# Patient Record
Sex: Male | Born: 1995 | Race: Black or African American | Hispanic: No | Marital: Single | Smoking: Never smoker
Health system: Southern US, Community
[De-identification: ages and names within clinical notes are randomized; demographics above are authoritative.]

## PROBLEM LIST (undated history)

## (undated) DIAGNOSIS — Z91010 Allergy to peanuts: Secondary | ICD-10-CM

---
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
## 2017-09-23 ENCOUNTER — Encounter: Payer: Self-pay | Admitting: Sports Medicine

## 2017-09-27 ENCOUNTER — Telehealth: Payer: Self-pay

## 2017-09-27 NOTE — Telephone Encounter (Signed)
Spoke to Tom Soto, head ATC.  Discussed PRP, [facility], or here.  Considering its free at his school I have recommended that he do it there.

## 2017-09-27 NOTE — Telephone Encounter (Signed)
I will but its all moot without touching the patient's knee.  Also they have to drop off the MRI.

## 2017-09-27 NOTE — Telephone Encounter (Signed)
Mother of pt left VM asking for you to call Pt coach Lynnae JanuaryBryan Lund at [phone] to answer some questions regarding possible PRP. Please assist.

## 2017-09-27 NOTE — Telephone Encounter (Signed)
Mother of pt notified. Would still like for you to look at MRI for a second opinion.

## 2017-09-29 NOTE — Telephone Encounter (Signed)
I received and personally reviewed medicines MRI, he does have increased T2 signal at the deep insertion of the patellar tendon on the patella.  This is consistent with mild patellar tendinitis.  He also has some increased T2 signal at the distal insertion of the quadriceps tendon on the superior pole of the patella consistent with quadriceps tendinitis.  There is also a mildly swollen deep infrapatellar bursa just deep to the tibial insertion of the patellar tendon.  There is a mild joint effusion.  Menisci look okay, patellofemoral cartilage as well as tibiofemoral cartilage look okay.  No visible intra-articular loose bodies, MCL, LCL, PCL, and ACL are all intact.  There is a small cyst just posterior to the tibial origin of the PCL.

## 2017-09-30 NOTE — Telephone Encounter (Signed)
Discussed with pt mother.

## 2017-10-24 ENCOUNTER — Ambulatory Visit (INDEPENDENT_AMBULATORY_CARE_PROVIDER_SITE_OTHER): Payer: BC Managed Care – PPO | Admitting: Sports Medicine

## 2017-10-24 ENCOUNTER — Encounter: Payer: Self-pay | Admitting: Sports Medicine

## 2017-10-24 DIAGNOSIS — G8929 Other chronic pain: Secondary | ICD-10-CM

## 2017-10-24 DIAGNOSIS — M25562 Pain in left knee: Secondary | ICD-10-CM | POA: Insufficient documentation

## 2017-10-24 MED ORDER — HYDROCODONE-ACETAMINOPHEN 5-325 MG PO TABS: 1.0000 | ORAL_TABLET | Freq: Three times a day (TID) | ORAL | 0 refills | Status: DC | PRN

## 2017-10-24 NOTE — Assessment & Plan Note (Signed)
The MRI did show multiple pathologic changes including a deep infrapatellar bursitis, there was some increased T2 signal at the patellar insertion of the patellar tendon. Clinically his symptoms are further down likely representing a deep infrapatellar bursitis/distal insertional patellar tendinitis. He has failed months of rehabilitation with his athletic trainer in college. PRP percutaneous tenotomy today. Knee immobilizer for a week, he can use Tylenol and narcotics for pain but should avoid NSAIDs. After a week I think it is okay for him to get into a regular knee brace and start gentle range of motion exercises for the next 2-3 weeks, followed by active range of motion exercises for 2-3 weeks after that and then followed by strengthening and eccentric rehab afterwards.

## 2017-10-24 NOTE — Progress Notes (Signed)
   Procedure: Real-time Ultrasound Guided Platelet Rich Plasma (PRP) Injection of  Device: GE Logiq E  Verbal informed consent obtained.  Time-out conducted.  Noted no overlying erythema, induration, or other signs of local infection.  Obtained 30 cc of blood from peripheral vein, using the "PEAK" centrifuge, red blood cells were separated from the plasma. Subsequently red blood cells were drained leaving only plasma with the buffy coat layer between the desired lines. Platelet poor plasma was then centrifuged out, and remaining platelet rich plasma aspirated into a 5 cc syringe.  Skin prepped in a sterile fashion.  Local anesthesia: Topical Ethyl chloride.  With sterile technique and under real time ultrasound guidance the platelet rich plasma (PRP) obtained above: Using a 22-gauge needle I injected 3 cc of lidocaine and 3 cc of bupivacaine both superficial to and deep to the distal tibial insertion of the patellar tendon, I then switched to the syringe and injected the full 3 mL of platelet rich plasma into and just deep to the distal tibial insertion of the patellar tendon performing a percutaneous fenestration as well as injection to the deep infrapatellar bursa. Completed without difficulty  Advised to call if fevers/chills, erythema, induration, drainage, or persistent bleeding.  Images permanently stored and available for review in the ultrasound unit.  Impression: Technically successful ultrasound guided Platelet Rich Plasma (PRP) injection.

## 2017-10-31 ENCOUNTER — Encounter: Payer: Self-pay | Admitting: Sports Medicine

## 2017-10-31 ENCOUNTER — Ambulatory Visit (INDEPENDENT_AMBULATORY_CARE_PROVIDER_SITE_OTHER): Payer: BC Managed Care – PPO | Admitting: Sports Medicine

## 2017-10-31 DIAGNOSIS — M25562 Pain in left knee: Secondary | ICD-10-CM

## 2017-10-31 DIAGNOSIS — G8929 Other chronic pain: Secondary | ICD-10-CM

## 2017-10-31 NOTE — Assessment & Plan Note (Signed)
1 week post distal patellar tendon insertion percutaneous tenotomy with PRP injection. We also placed some medication in the deep infrapatellar bursa. He returns today completely pain-free. [facility] and his trainer will take over eccentric rehabilitation. They can call me with any questions.

## 2017-10-31 NOTE — Progress Notes (Signed)
  Subjective: Tom Soto returns, he is 1 week post PRP percutaneous tenotomy at the distal left patellar tendon insertion as well as deep infrapatellar bursa, he is completely pain-free.  Objective: General: Well-developed, well-nourished, and in no acute distress. Left knee: Normal to inspection with no erythema or effusion or obvious bony abnormalities. Palpation normal with no warmth or joint line tenderness or patellar tenderness or condyle tenderness. ROM normal in flexion and extension and lower leg rotation. Ligaments with solid consistent endpoints including ACL, PCL, LCL, MCL. Negative Mcmurray's and provocative meniscal tests. Non painful patellar compression. Patellar and quadriceps tendons unremarkable. Hamstring and quadriceps strength is normal.  Assessment/plan:   Left knee pain 1 week post distal patellar tendon insertion percutaneous tenotomy with PRP injection. We also placed some medication in the deep infrapatellar bursa. He returns today completely pain-free. [facility] [place] and his trainer will take over eccentric rehabilitation. They can call me with any questions. ___________________________________________ Ihor Austinhomas J. Benjamin Stainhekkekandam, M.D., ABFM., CAQSM. Primary Care and Sports Medicine [facility]  Adjunct Instructor of Family Medicine  [employer]

## 2017-11-01 ENCOUNTER — Ambulatory Visit: Payer: BC Managed Care – PPO | Admitting: Sports Medicine

## 2019-01-25 ENCOUNTER — Ambulatory Visit (INDEPENDENT_AMBULATORY_CARE_PROVIDER_SITE_OTHER): Payer: BC Managed Care – PPO

## 2019-01-25 ENCOUNTER — Encounter: Payer: Self-pay | Admitting: Sports Medicine

## 2019-01-25 ENCOUNTER — Other Ambulatory Visit: Payer: Self-pay

## 2019-01-25 ENCOUNTER — Ambulatory Visit (INDEPENDENT_AMBULATORY_CARE_PROVIDER_SITE_OTHER): Payer: BC Managed Care – PPO | Admitting: Sports Medicine

## 2019-01-25 DIAGNOSIS — M7651 Patellar tendinitis, right knee: Secondary | ICD-10-CM

## 2019-01-25 DIAGNOSIS — M25561 Pain in right knee: Secondary | ICD-10-CM | POA: Diagnosis not present

## 2019-01-25 DIAGNOSIS — M7652 Patellar tendinitis, left knee: Secondary | ICD-10-CM | POA: Diagnosis not present

## 2019-01-25 NOTE — Progress Notes (Signed)
Subjective:    CC: Right knee pain  HPI: This is a pleasant 23 year old male, softball player at the [employer] last year we did a PRP percutaneous tenotomy of the distal patellar tendon insertion on the left side.  He did extremely well, he is now having similar symptoms on the right, present for 2 weeks now, has been doing some home rehabilitation exercises without any improvement.  He and his trainers are interested in pursuing PRP percutaneous tenotomy on the right side.  Symptoms are moderate, persistent, localized without radiation.  I reviewed the past medical history, family history, social history, surgical history, and allergies today and no changes were needed.  Please see the problem list section below in epic for further details.  Past Medical History: Past Medical History:  Diagnosis Date  . History of peanut allergy    rash only; does not need epi-pen   Past Surgical History: No past surgical history on file. Social History: Social History   Socioeconomic History  . Marital status: Single    Spouse name: Not on file  . Number of children: Not on file  . Years of education: Not on file  . Highest education level: Not on file  Occupational History  . Not on file  Social Needs  . Financial resource strain: Not on file  . Food insecurity:    Worry: Not on file    Inability: Not on file  . Transportation needs:    Medical: Not on file    Non-medical: Not on file  Tobacco Use  . Smoking status: Never Smoker  . Smokeless tobacco: Never Used  Substance and Sexual Activity  . Alcohol use: No  . Drug use: No  . Sexual activity: Not on file  Lifestyle  . Physical activity:    Days per week: Not on file    Minutes per session: Not on file  . Stress: Not on file  Relationships  . Social connections:    Talks on phone: Not on file    Gets together: Not on file    Attends religious service: Not on file    Active member of club or organization: Not on  file    Attends meetings of clubs or organizations: Not on file    Relationship status: Not on file  Other Topics Concern  . Not on file  Social History Narrative  . Not on file   Family History: Family History  Problem Relation Age of Onset  . Diabetes Paternal Aunt   . Heart failure Maternal Uncle   . Hypertension Maternal Uncle    Allergies: Allergies  Allergen Reactions  . Peanut Oil Itching    Itching in throat, no swelling   . Other    Medications: See med rec.  Review of Systems: No fevers, chills, night sweats, weight loss, chest pain, or shortness of breath.   Objective:    General: Well Developed, well nourished, and in no acute distress.  Neuro: Alert and oriented x3, extra-ocular muscles intact, sensation grossly intact.  HEENT: Normocephalic, atraumatic, pupils equal round reactive to light, neck supple, no masses, no lymphadenopathy, thyroid nonpalpable.  Skin: Warm and dry, no rashes. Cardiac: Regular rate and rhythm, no murmurs rubs or gallops, no lower extremity edema.  Respiratory: Clear to auscultation bilaterally. Not using accessory muscles, speaking in full sentences. Right knee: Normal to inspection with no erythema or effusion or obvious bony abnormalities. Palpation normal with no warmth or joint line tenderness or patellar tenderness or  condyle tenderness. ROM normal in flexion and extension and lower leg rotation. Ligaments with solid consistent endpoints including ACL, PCL, LCL, MCL. Negative Mcmurray's and provocative meniscal tests. Non painful patellar compression. Tender to palpation at the distal insertion of the patellar tendon. Hamstring and quadriceps strength is normal.  Impression and Recommendations:    Patellar tendinitis of right knee Distal patellar tendinitis, he did extremely well with a PRP percutaneous tenotomy on the left side 1 year ago. He has done a couple of weeks of aggressive physical therapy with his trainers  without much improvement. X-rays today. Avoid NSAIDs, hydrate aggressively and he can come back next week for the PRP procedure.   ___________________________________________ Ihor Austinhomas J. Benjamin Stainhekkekandam, M.D., ABFM., CAQSM. Primary Care and Sports Medicine [facility]  Adjunct Professor of Family Medicine  [employer]

## 2019-01-25 NOTE — Assessment & Plan Note (Signed)
Distal patellar tendinitis, he did extremely well with a PRP percutaneous tenotomy on the left side 1 year ago. He has done a couple of weeks of aggressive physical therapy with his trainers without much improvement. X-rays today. Avoid NSAIDs, hydrate aggressively and he can come back next week for the PRP procedure.

## 2019-01-31 ENCOUNTER — Ambulatory Visit (INDEPENDENT_AMBULATORY_CARE_PROVIDER_SITE_OTHER): Payer: BC Managed Care – PPO | Admitting: Sports Medicine

## 2019-01-31 ENCOUNTER — Encounter: Payer: Self-pay | Admitting: Sports Medicine

## 2019-01-31 DIAGNOSIS — M7651 Patellar tendinitis, right knee: Secondary | ICD-10-CM

## 2019-01-31 MED ORDER — HYDROCODONE-ACETAMINOPHEN 5-325 MG PO TABS: 1.0000 | ORAL_TABLET | Freq: Three times a day (TID) | ORAL | 0 refills | Status: DC | PRN

## 2019-01-31 NOTE — Progress Notes (Addendum)
   Procedure: Real-time Ultrasound Guided Platelet Rich Plasma (PRP) percutaneous tenotomy of right distal patellar tendon insertion Device: GE Logiq E  Verbal informed consent obtained.  Time-out conducted.  Noted no overlying erythema, induration, or other signs of local infection.  Obtained 30 cc of blood from peripheral vein, using the "PEAK" centrifuge, red blood cells were separated from the plasma. Subsequently red blood cells were drained leaving only plasma with the buffy coat layer between the desired lines. Platelet poor plasma was then centrifuged out, and remaining platelet rich plasma aspirated into a 5 cc syringe.  Skin prepped in a sterile fashion.  Local anesthesia: Topical Ethyl chloride.  With sterile technique and under real time ultrasound guidance the platelet rich plasma (PRP) obtained above: I injected 3 cc lidocaine, 3 cc bupivacaine into and around the distal insertion of the patellar tendon using ultrasound guidance, the syringe was switched and I injected the entirety of platelet rich plasma into the distal, deep insertion of the patellar tendon at the tibial tubercle. Completed without difficulty  Advised to call if fevers/chills, erythema, induration, drainage, or persistent bleeding.  Images permanently stored and available for review in the ultrasound unit.  Impression: Technically successful ultrasound guided Platelet Rich Plasma (PRP) injection.  ______________________________________________________________________________________  Patellar tendinitis of right knee Distal patellar tendinitis/deep infrapatellar bursitis, did extremely well with PRP percutaneous tenotomy on the left side 1 year ago. He has similar pain on the right, and has done several weeks of aggressive formal physical therapy with his trainers without much improvement. He has avoided NSAIDs and aggressively hydrated, and we performed a PRP percutaneous tenotomy of the deep infrapatellar bursa  and distal patellar tendon insertion today. Hydrocodone for postprocedural pain. Knee immobilizer for a 2 or 3 days to start. I have discussed my postoperative protocol with his athletic trainers, return to see me in 2 weeks.  The procedure was videotaped for educational purposes, verbal consent was obtained from the patient.   ___________________________________________ Ihor Austin. Benjamin Stain, M.D., ABFM., CAQSM. Primary Care and Sports Medicine [facility]  Adjunct Professor of Family Medicine  [employer]

## 2019-01-31 NOTE — Patient Instructions (Signed)
Knee immobilizer for 3 days. May ice the knee for 20 minutes 3-4 times a day, avoid anti-inflammatories such as aspirin, ibuprofen, naproxen. Use hydrocodone for pain. Gentle protected range of motion for the next 3 to 7 days. Then follow rehabilitation protocol from your athletic trainers.

## 2019-01-31 NOTE — Assessment & Plan Note (Addendum)
Distal patellar tendinitis/deep infrapatellar bursitis, did extremely well with PRP percutaneous tenotomy on the left side 1 year ago. He has similar pain on the right, and has done several weeks of aggressive formal physical therapy with his trainers without much improvement. He has avoided NSAIDs and aggressively hydrated, and we performed a PRP percutaneous tenotomy of the deep infrapatellar bursa and distal patellar tendon insertion today. Hydrocodone for postprocedural pain. Knee immobilizer for a 2 or 3 days to start. I have discussed my postoperative protocol with his athletic trainers, return to see me in 2 weeks.  The procedure was videotaped for educational purposes, verbal consent was obtained from the patient.

## 2019-02-01 ENCOUNTER — Telehealth: Payer: Self-pay | Admitting: *Deleted

## 2019-02-01 NOTE — Telephone Encounter (Signed)
Pt left vm this morning wanting to know if you'd write him a note excusing him from his online classes today.  He stated that he's a little sore and just wanted to take it easy today.

## 2019-02-01 NOTE — Telephone Encounter (Signed)
Of course.  I'll be in the printer in the am.  If not, please print it out and he can pick it up.

## 2019-02-02 ENCOUNTER — Telehealth: Payer: Self-pay | Admitting: *Deleted

## 2019-02-02 NOTE — Telephone Encounter (Signed)
That likely has to do with the trauma we created in the knee as well as wearing the knee immobilizer. Since we did not make an incision there is no limitation on getting the procedural site wet.  Elevate the leg when resting above the heart.  I think he can probably discontinue the knee immobilizer if he feels okay.

## 2019-02-02 NOTE — Telephone Encounter (Signed)
Letter printed and given to Tom Soto to email to pt's mom.

## 2019-02-02 NOTE — Telephone Encounter (Signed)
Pt's mom left vm stating that pt's foot is swelling and wanted to know if that's normal.  Also, she wanted to know when he'd be abloe to take a shower instead of sponging off.  Please advise.

## 2019-02-02 NOTE — Telephone Encounter (Signed)
Pt and pt's mom notified of instructions.

## 2019-02-09 ENCOUNTER — Encounter: Payer: Self-pay | Admitting: Sports Medicine

## 2019-02-09 ENCOUNTER — Ambulatory Visit (INDEPENDENT_AMBULATORY_CARE_PROVIDER_SITE_OTHER): Payer: BC Managed Care – PPO | Admitting: Sports Medicine

## 2019-02-09 DIAGNOSIS — M7651 Patellar tendinitis, right knee: Secondary | ICD-10-CM | POA: Diagnosis not present

## 2019-02-09 NOTE — Assessment & Plan Note (Signed)
Now approximately 9 days post PRP percutaneous tenotomy of the distal patellar tendon. For the most part pain-free, knee is a bit stiff. Transition into a reaction knee brace. He will work with his trainers on gentle passive range of motion for the next week and then active range of motion for another couple of weeks, and then strengthening. No more pain medication needed. I'd like to see him back in a month.

## 2019-02-09 NOTE — Progress Notes (Signed)
Subjective:    CC: Right knee  HPI: Tom Soto returns, he is approximately 9 days post platelet rich plasma percutaneous tenotomy of the right distal patellar tendon, near the tibial insertion.  He is doing very well, he has no pain.  I reviewed the past medical history, family history, social history, surgical history, and allergies today and no changes were needed.  Please see the problem list section below in epic for further details.  Past Medical History: Past Medical History:  Diagnosis Date  . History of peanut allergy    rash only; does not need epi-pen   Past Surgical History: No past surgical history on file. Social History: Social History   Socioeconomic History  . Marital status: Single    Spouse name: Not on file  . Number of children: Not on file  . Years of education: Not on file  . Highest education level: Not on file  Occupational History  . Not on file  Social Needs  . Financial resource strain: Not on file  . Food insecurity:    Worry: Not on file    Inability: Not on file  . Transportation needs:    Medical: Not on file    Non-medical: Not on file  Tobacco Use  . Smoking status: Never Smoker  . Smokeless tobacco: Never Used  Substance and Sexual Activity  . Alcohol use: No  . Drug use: No  . Sexual activity: Not on file  Lifestyle  . Physical activity:    Days per week: Not on file    Minutes per session: Not on file  . Stress: Not on file  Relationships  . Social connections:    Talks on phone: Not on file    Gets together: Not on file    Attends religious service: Not on file    Active member of club or organization: Not on file    Attends meetings of clubs or organizations: Not on file    Relationship status: Not on file  Other Topics Concern  . Not on file  Social History Narrative  . Not on file   Family History: Family History  Problem Relation Age of Onset  . Diabetes Paternal Aunt   . Heart failure Maternal Uncle   .  Hypertension Maternal Uncle    Allergies: Allergies  Allergen Reactions  . Peanut Oil Itching    Itching in throat, no swelling   . Other    Medications: See med rec.  Review of Systems: No fevers, chills, night sweats, weight loss, chest pain, or shortness of breath.   Objective:    General: Well Developed, well nourished, and in no acute distress.  Neuro: Alert and oriented x3, extra-ocular muscles intact, sensation grossly intact.  HEENT: Normocephalic, atraumatic, pupils equal round reactive to light, neck supple, no masses, no lymphadenopathy, thyroid nonpalpable.  Skin: Warm and dry, no rashes. Cardiac: Regular rate and rhythm, no murmurs rubs or gallops, no lower extremity edema.  Respiratory: Clear to auscultation bilaterally. Not using accessory muscles, speaking in full sentences. Right knee: Normal to inspection with no erythema or effusion or obvious bony abnormalities. Palpation normal with no warmth or joint line tenderness or patellar tenderness or condyle tenderness. Minimal fullness in the distal patellar tendon without discrete tenderness to palpation, he does have some apprehension to full flexion but is able to achieve it without much discomfort. Ligaments with solid consistent endpoints including ACL, PCL, LCL, MCL. Negative Mcmurray's and provocative meniscal tests. Non painful patellar compression.  Patellar and quadriceps tendons unremarkable. Hamstring and quadriceps strength is normal.  Impression and Recommendations:    Patellar tendinitis of right knee Now approximately 9 days post PRP percutaneous tenotomy of the distal patellar tendon. For the most part pain-free, knee is a bit stiff. Transition into a reaction knee brace. He will work with his trainers on gentle passive range of motion for the next week and then active range of motion for another couple of weeks, and then strengthening. No more pain medication needed. I'd like to see him back in a  month.   ___________________________________________ Tom Soto, M.D., ABFM., CAQSM. Primary Care and Sports Medicine [facility]  Adjunct Professor of Family Medicine  [employer]

## 2019-02-12 ENCOUNTER — Ambulatory Visit: Payer: BC Managed Care – PPO | Admitting: Sports Medicine

## 2019-03-09 ENCOUNTER — Ambulatory Visit: Payer: BC Managed Care – PPO | Admitting: Sports Medicine

## 2019-10-30 ENCOUNTER — Ambulatory Visit: Payer: BC Managed Care – PPO | Attending: Internal Medicine

## 2019-10-31 ENCOUNTER — Ambulatory Visit: Payer: BC Managed Care – PPO | Attending: Internal Medicine

## 2019-10-31 DIAGNOSIS — Z20822 Contact with and (suspected) exposure to covid-19: Secondary | ICD-10-CM

## 2019-11-01 LAB — NOVEL CORONAVIRUS, NAA: SARS-CoV-2, NAA: DETECTED — AB

## 2019-11-05 ENCOUNTER — Other Ambulatory Visit: Payer: BC Managed Care – PPO

## 2020-07-14 IMAGING — DX LEFT KNEE - 1-2 VIEW
4 series · 4 of 4 positions shown · non-contrast
Comparison: None.

CLINICAL DATA: Right knee pain for the past 2 weeks. Pain is
primarily below the patella. History tenotomy of the distal
left-sided patellar tendon last year, now with similar symptoms
within the right lower extremity.

EXAM:
LEFT KNEE - 1-2 VIEW

[tunnel]
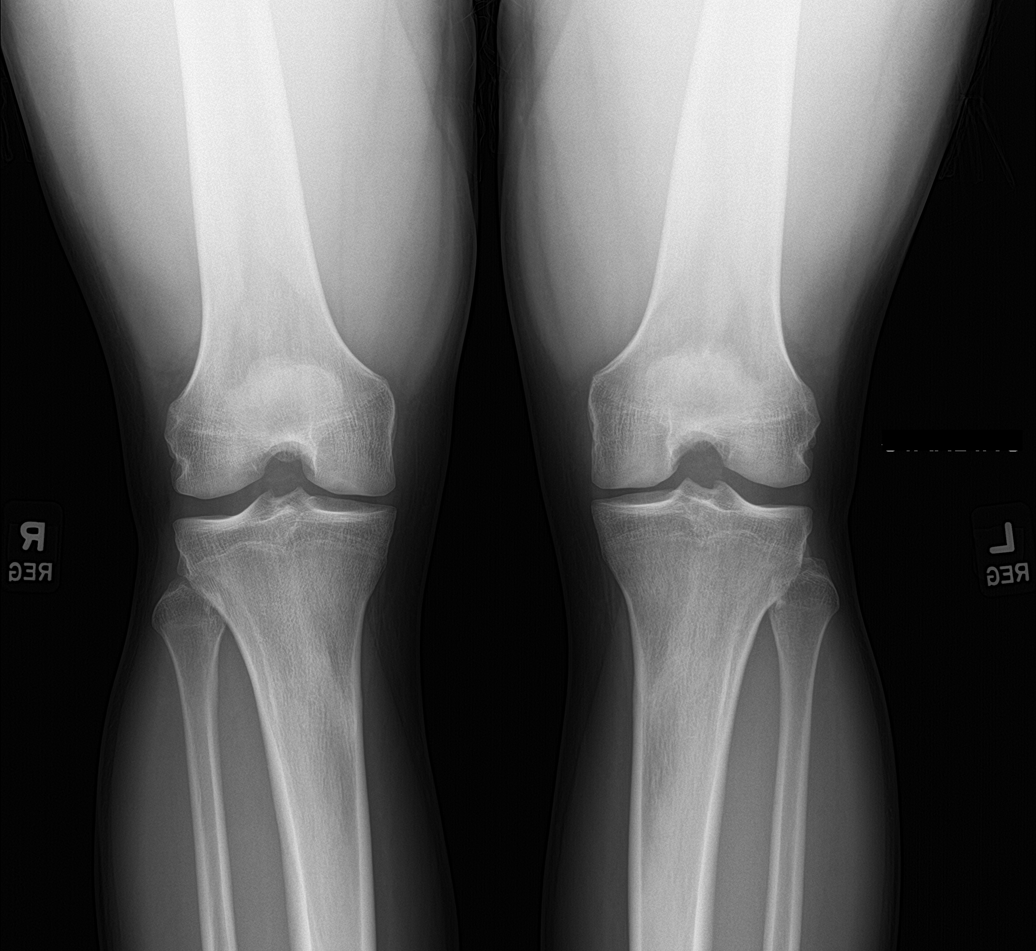

[knee lat]
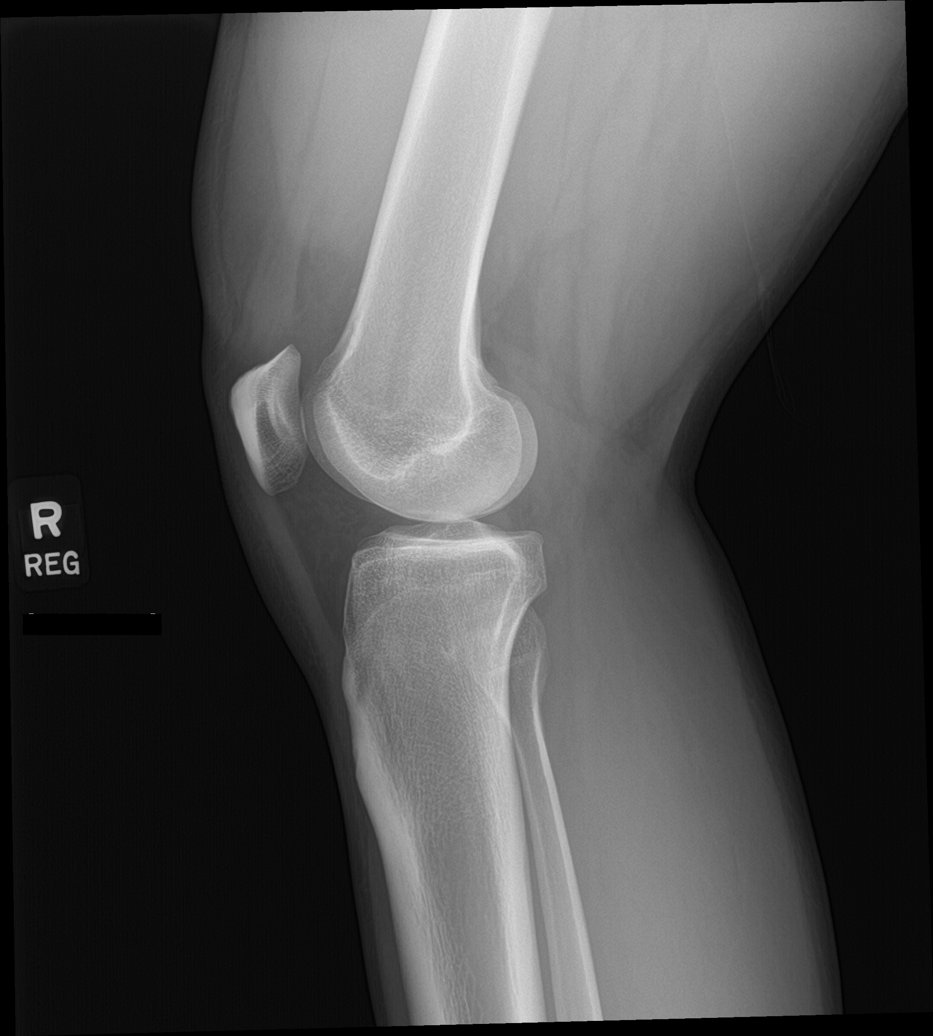

[knee sunrise]
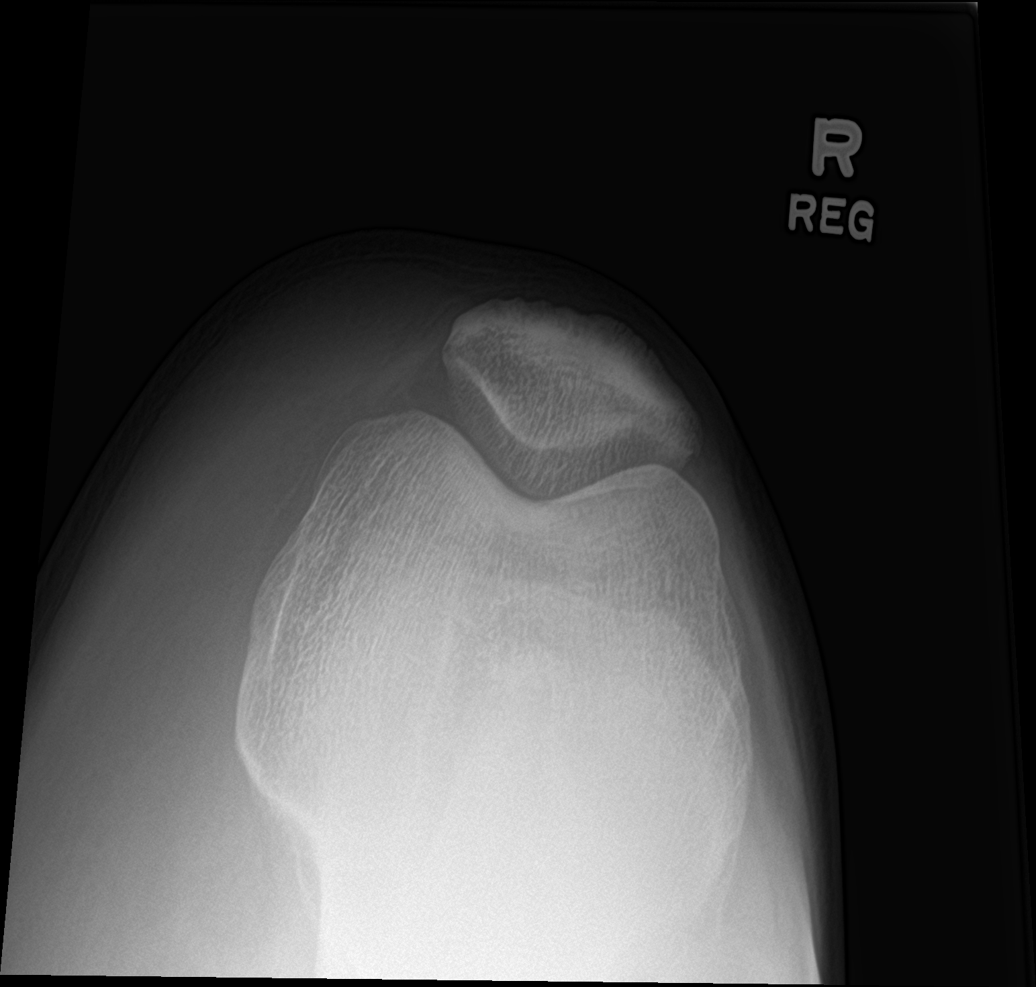

[knee ap bilat standing]
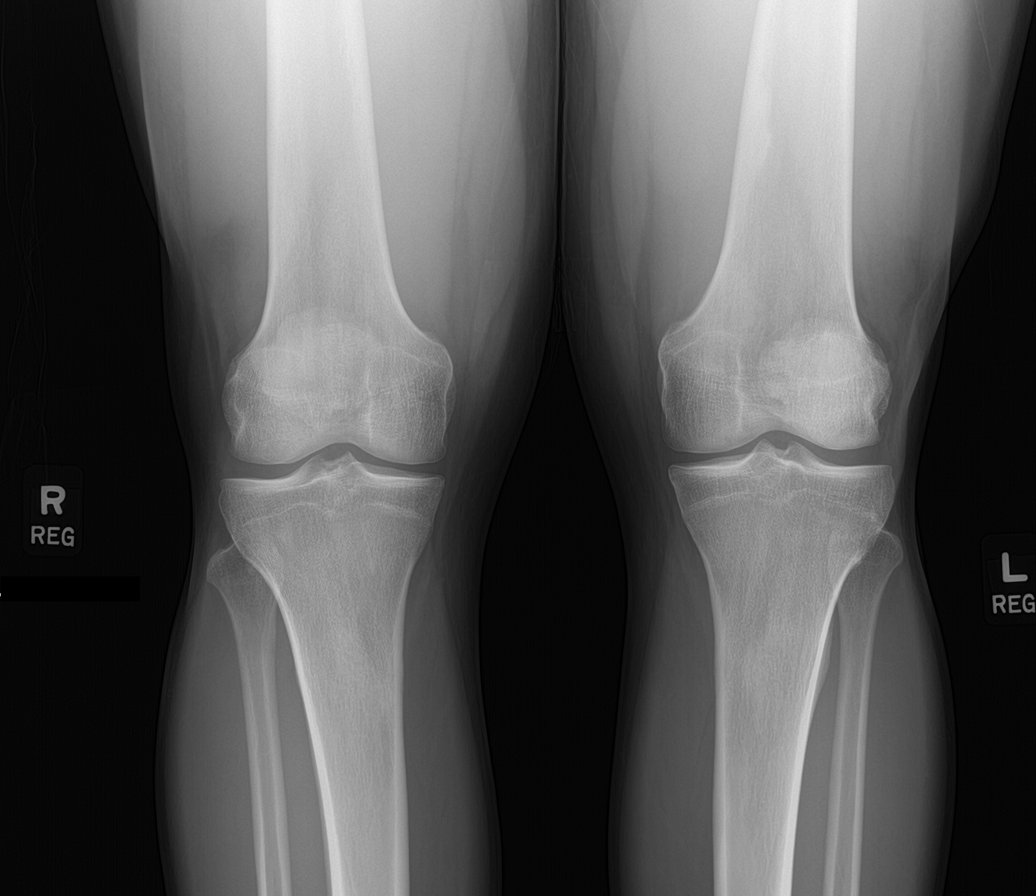

[4 of 4 positions shown; findings below may reference images not displayed]

FINDINGS: No fracture or dislocation. Left-sided medial and lateral joint
spaces appear preserved. No evidence of calcific tendinitis.
Regional soft tissues appear normal.
IMPRESSION: No explanation for patient's left knee pain.
